# Patient Record
Sex: Female | Born: 1985 | Race: White | Hispanic: No | Marital: Married | Smoking: Never smoker
Health system: Southern US, Community
[De-identification: ages and names within clinical notes are randomized; demographics above are authoritative.]

## PROBLEM LIST (undated history)

## (undated) DIAGNOSIS — I10 Essential (primary) hypertension: Secondary | ICD-10-CM

---
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
## 2005-10-19 ENCOUNTER — Emergency Department (HOSPITAL_COMMUNITY): Admission: EM | Admit: 2005-10-19 | Discharge: 2005-10-19 | Payer: Self-pay | Admitting: Emergency Medicine

## 2006-06-21 ENCOUNTER — Emergency Department (HOSPITAL_COMMUNITY): Admission: EM | Admit: 2006-06-21 | Discharge: 2006-06-21 | Payer: Self-pay | Admitting: Emergency Medicine

## 2007-07-14 ENCOUNTER — Emergency Department (HOSPITAL_COMMUNITY): Admission: EM | Admit: 2007-07-14 | Discharge: 2007-07-14 | Payer: Self-pay | Admitting: Emergency Medicine

## 2014-01-19 ENCOUNTER — Emergency Department
Admission: EM | Admit: 2014-01-19 | Discharge: 2014-01-19 | Disposition: A | Discharge status: Home / Self Care | Payer: Self-pay | Source: Home / Self Care | Attending: Emergency Medicine | Admitting: Emergency Medicine

## 2014-01-19 ENCOUNTER — Encounter: Payer: Self-pay | Admitting: *Deleted

## 2014-01-19 ENCOUNTER — Emergency Department (INDEPENDENT_AMBULATORY_CARE_PROVIDER_SITE_OTHER): Payer: Self-pay

## 2014-01-19 DIAGNOSIS — M79671 Pain in right foot: Secondary | ICD-10-CM

## 2014-01-19 DIAGNOSIS — M79672 Pain in left foot: Secondary | ICD-10-CM

## 2014-01-19 DIAGNOSIS — M7732 Calcaneal spur, left foot: Secondary | ICD-10-CM

## 2014-01-19 DIAGNOSIS — S99921A Unspecified injury of right foot, initial encounter: Secondary | ICD-10-CM

## 2014-01-19 DIAGNOSIS — S90122A Contusion of left lesser toe(s) without damage to nail, initial encounter: Secondary | ICD-10-CM

## 2014-01-19 HISTORY — DX: Essential (primary) hypertension: I10

## 2014-01-19 NOTE — ED Notes (Signed)
Pt hit  L foot on a door 1 1/2 months ago and the pain never really went away.  2 days ago pt stepped wrong and had a very sharp pain in the L foot and it swelled.  Pain is 5/10 and foot swelling is still there.

## 2014-01-19 NOTE — ED Provider Notes (Signed)
CSN: [id number]     Arrival date & time 01/19/14  1454 History   First MD Initiated Contact with Patient 01/19/14 1459     Chief Complaint  Patient presents with  . Foot Pain    L   (Consider location/radiation/quality/duration/timing/severity/associated sxs/prior Treatment) HPI Pt hit L foot on a door 1 1/2 months ago and the pain never really went away. 2 days ago pt stepped wrong and had a very sharp pain in the L foot and it swelled. Pain is 5/10 and foot swelling is still there. Pain is sharp, hurts to weight-bear and move left foot. No radiation or paresthesias. Past Medical History  Diagnosis Date  . Hypertension    Past Surgical History  Procedure Laterality Date  . Cesarean section     History reviewed. No pertinent family history. History  Substance Use Topics  . Smoking status: Never Smoker   . Smokeless tobacco: Never Used  . Alcohol Use: No   OB History    No data available     Review of Systems  All other systems reviewed and are negative.   Allergies  Penicillins  Home Medications   Prior to Admission medications   Medication Sig Start Date End Date Taking? Authorizing Provider  atenolol (TENORMIN) 25 MG tablet Take by mouth daily.   Yes Historical Provider, MD   BP 120/81 mmHg  Pulse 80  Temp(Src) 98 F (36.7 C) (Oral)  Ht 5\' 2"  (1.575 m)  Wt 213 lb 8 oz (96.843 kg)  BMI 39.04 kg/m2  SpO2 97%  LMP 12/29/2013 Physical Exam  Constitutional: She is oriented to person, place, and time. She appears well-developed and well-nourished. No distress.  Uncomfortable from left foot pain. She avoids weightbearing.  HENT:  Head: Normocephalic and atraumatic.  Eyes: Conjunctivae and EOM are normal. Pupils are equal, round, and reactive to light. No scleral icterus.  Neck: Normal range of motion.  Cardiovascular: Normal rate.   Pulmonary/Chest: Effort normal.  Abdominal: She exhibits no distension.  Musculoskeletal: Normal range of motion.       Left  ankle: Normal.       Left foot: There is tenderness, bony tenderness and swelling. There is normal range of motion, normal capillary refill and no laceration.  Neurological: She is alert and oriented to person, place, and time.  Skin: Skin is warm.  Psychiatric: She has a normal mood and affect.  Nursing note and vitals reviewed.  Left foot:Tender, swollen diffusely right forefoot and midfoot. Especially second third and fourth metatarsals. Nontender over fifth metatarsal.no heat or redness or fluctuance or red streaks. No ankle tenderness or abnormality ED Course  Procedures (including critical care time) Labs Review Labs Reviewed - No data to display  Imaging Review Dg Foot Complete Left  01/19/2014   CLINICAL DATA:  Pain laterally following trauma 6 weeks prior  EXAM: LEFT FOOT - COMPLETE 3+ VIEW  COMPARISON:  None.  FINDINGS: Frontal, oblique, and lateral views were obtained. There is mild diffuse soft tissue swelling. No fracture or dislocation. Joint spaces appear intact. No erosive change. There is a small spur arising from the posterior calcaneus.  IMPRESSION: Soft tissue swelling. No fracture or dislocation. No appreciable arthropathy. Small posterior calcaneal spur.   Electronically Signed   By: Bretta BangWilliam  Woodruff M.D.   On: 01/19/2014 16:06     MDM  Contusion/sprain left foot She and husband declined prescription pain meds,she prefers to use ibuprofen OTC 600 mg every 6-8 hours with food for pain  Ace bandage and postop shoe Encourage rest, ice, compression with ACE bandage, and elevation of injured body part.  Follow-up with sports med/ortho in 5-7 days if not improving, or sooner if symptoms become worse. Precautions discussed. Red flags discussed. Questions invited and answered. Patient voiced understanding and agreement.      Lajean Manesavid Massey, MD 01/19/14 [phone]

## 2016-06-27 IMAGING — CR DG FOOT COMPLETE 3+V*L*
3 series · 3 of 3 positions shown · non-contrast
Comparison: None.

CLINICAL DATA: Pain laterally following trauma 6 weeks prior

EXAM:
LEFT FOOT - COMPLETE 3+ VIEW

[view not recorded (1 of 3)]
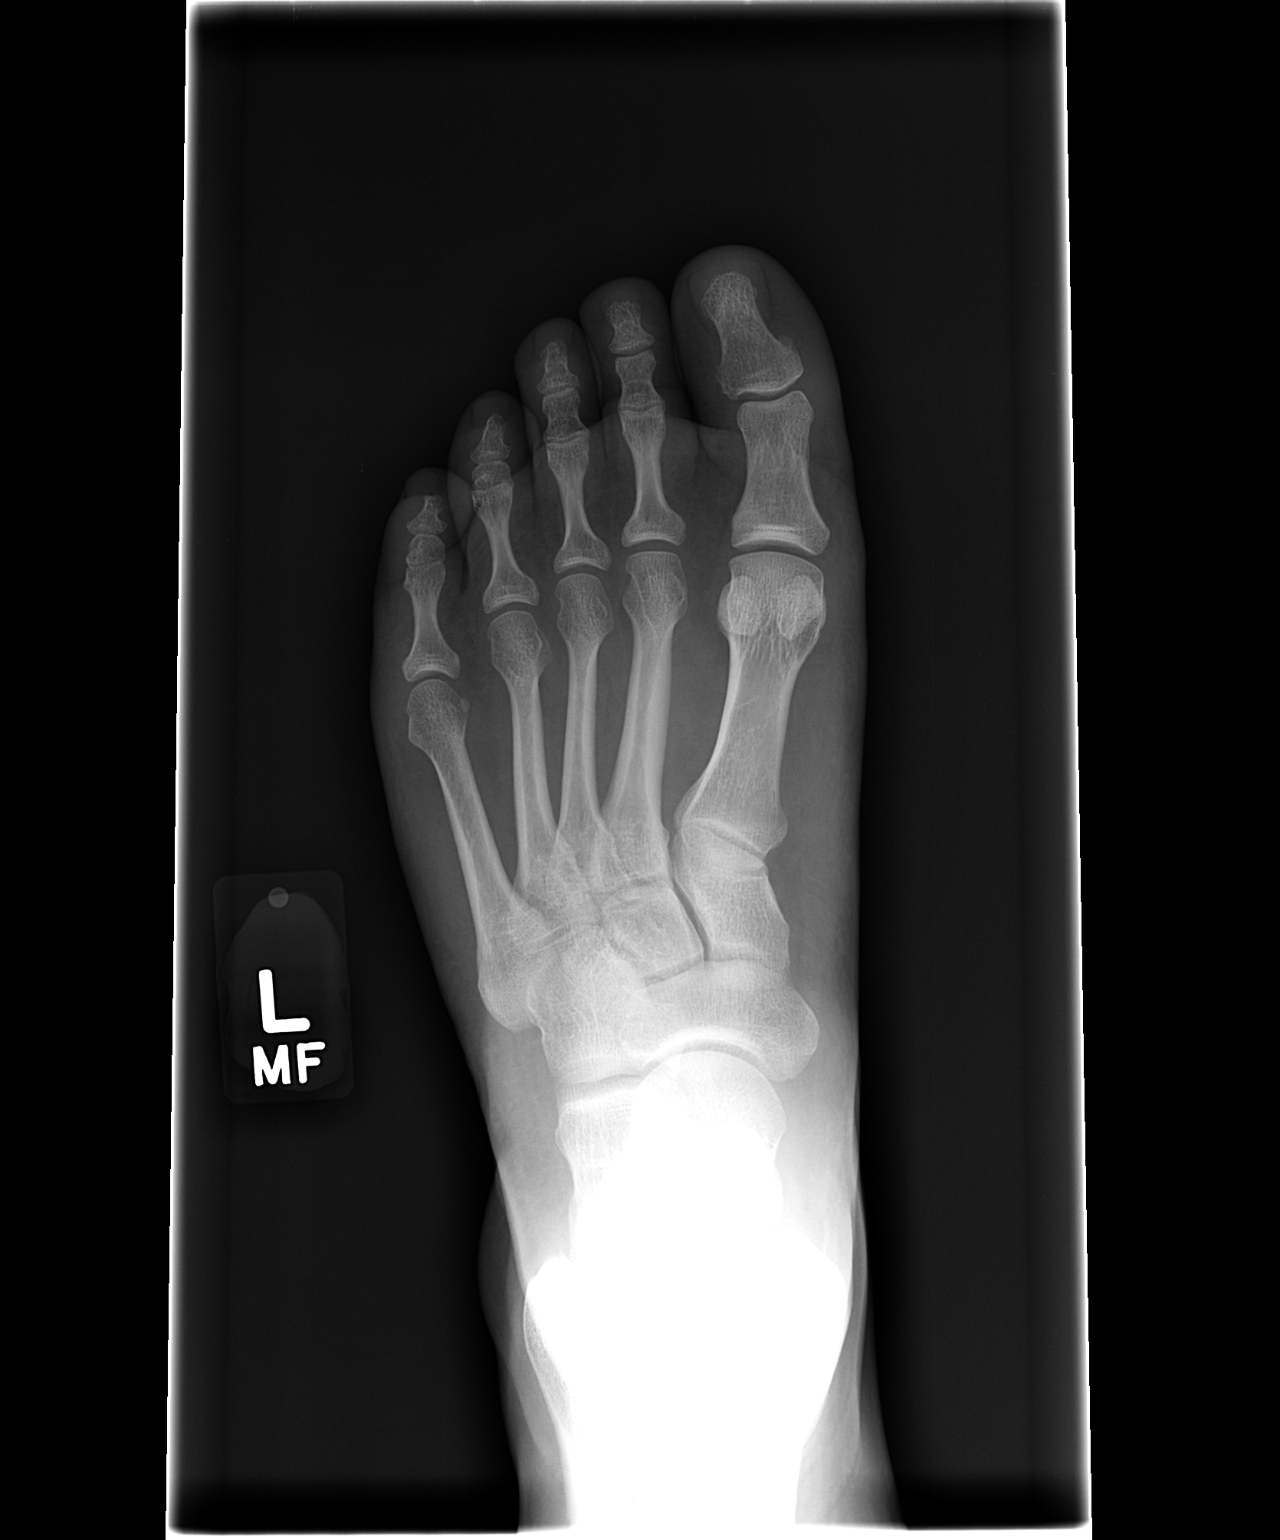

[view not recorded (2 of 3)]
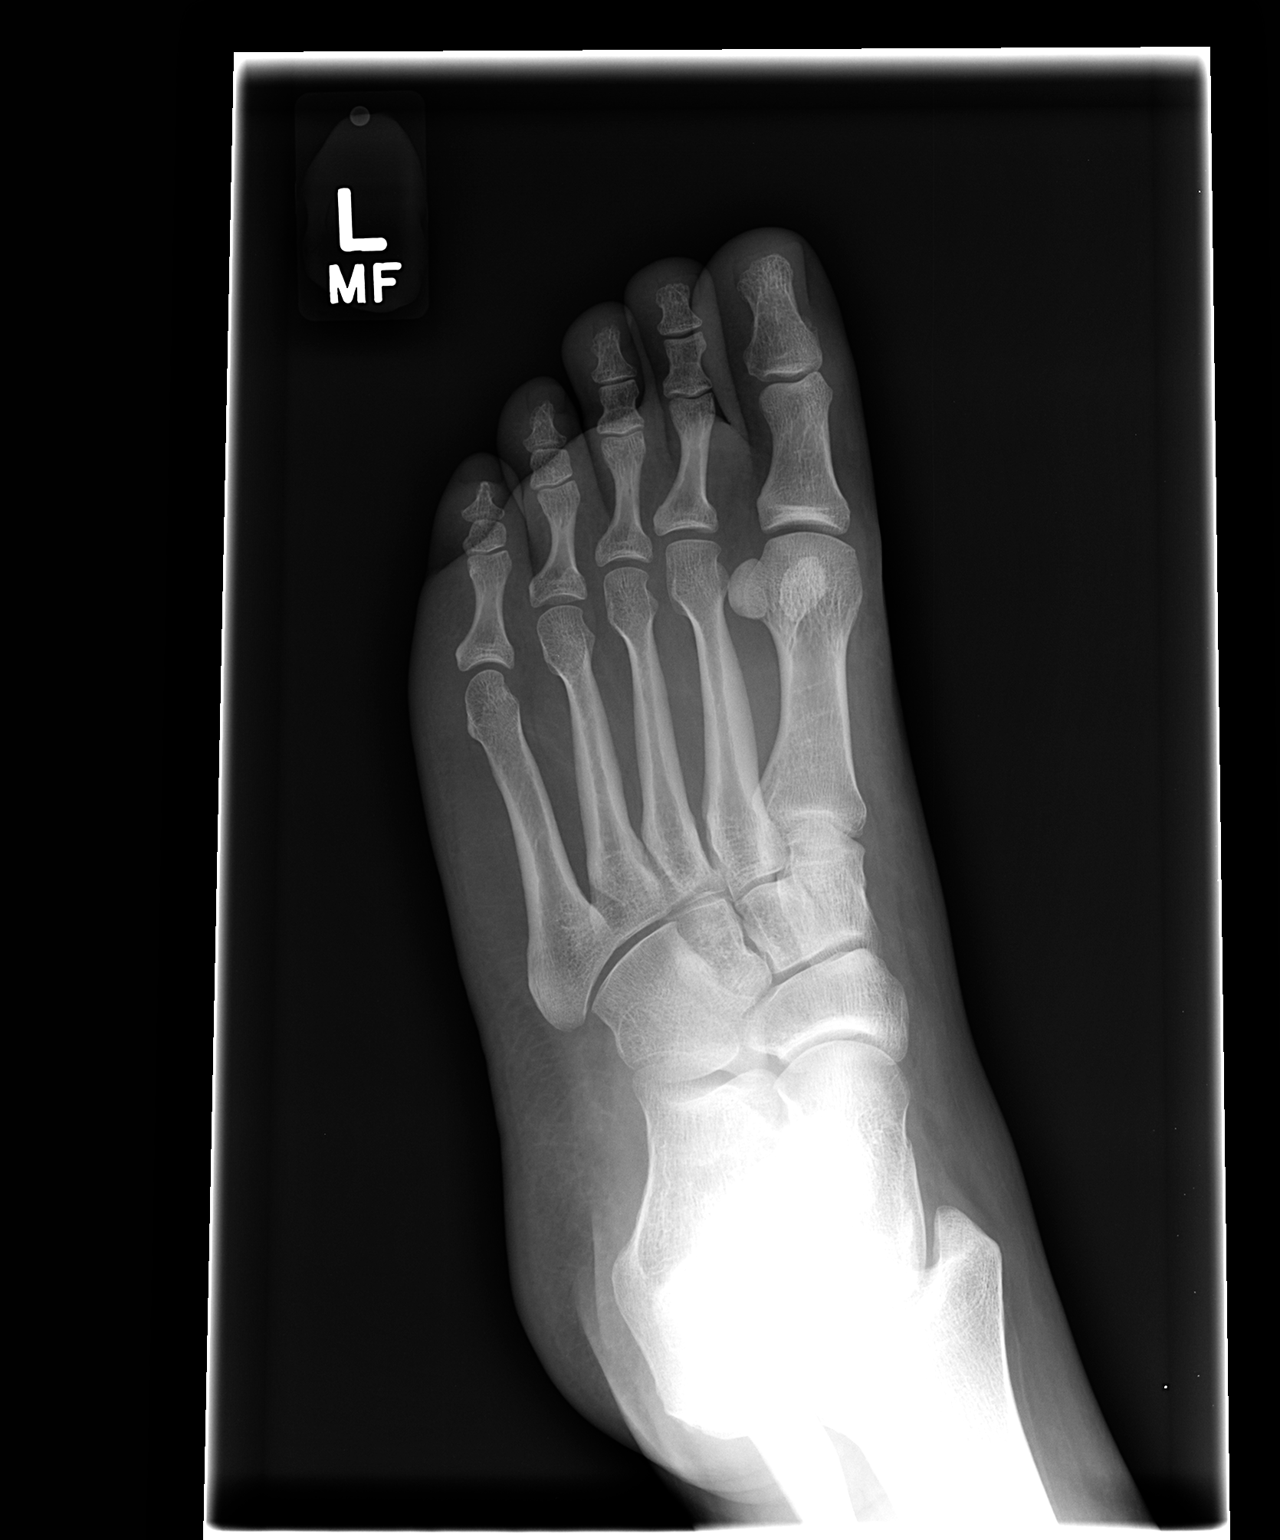

[view not recorded (3 of 3)]
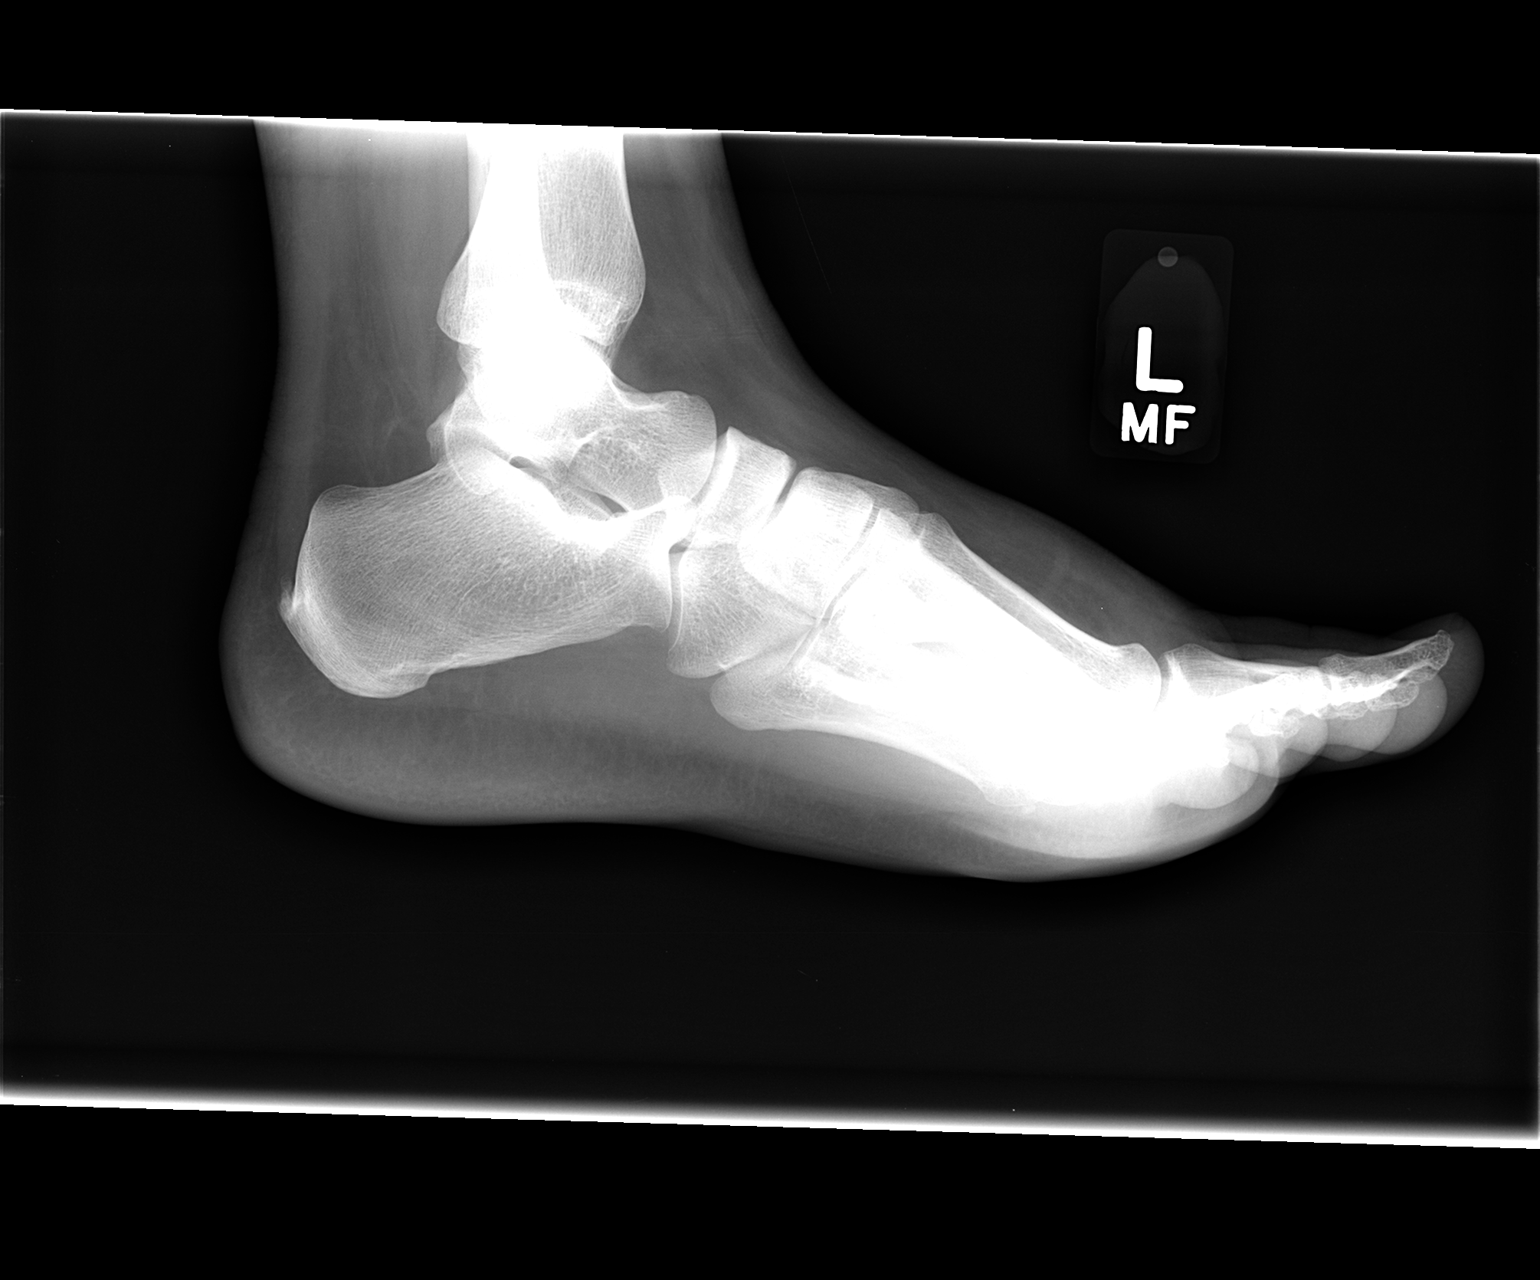

[3 of 3 positions shown; findings below may reference images not displayed]

FINDINGS: Frontal, oblique, and lateral views were obtained. There is mild
diffuse soft tissue swelling. No fracture or dislocation. Joint
spaces appear intact. No erosive change. There is a small spur
arising from the posterior calcaneus.
IMPRESSION: Soft tissue swelling. No fracture or dislocation. No appreciable
arthropathy. Small posterior calcaneal spur.
